# Patient Record
Sex: Female | Born: 1940 | Marital: Married | Smoking: Never smoker
Health system: Southern US, Community
[De-identification: ages and names within clinical notes are randomized; demographics above are authoritative.]

---

## 2013-08-10 ENCOUNTER — Telehealth: Payer: Self-pay | Admitting: *Deleted

## 2013-08-10 NOTE — Telephone Encounter (Signed)
Called pt and confirmed 08/24/13 appt w/ pt.  Mailed before appt letter & packet to pt.  Took paperwork to Med Rec for chart.

## 2013-08-11 ENCOUNTER — Telehealth: Payer: Self-pay | Admitting: Hematology & Oncology

## 2013-08-11 NOTE — Telephone Encounter (Signed)
Received medical records on pt it appears she has been scheduled at [facility] and is aware of appointment

## 2013-08-18 ENCOUNTER — Other Ambulatory Visit: Payer: Self-pay | Admitting: *Deleted

## 2013-08-18 ENCOUNTER — Encounter: Payer: Self-pay | Admitting: *Deleted

## 2013-08-18 DIAGNOSIS — Z853 Personal history of malignant neoplasm of breast: Secondary | ICD-10-CM

## 2013-08-18 NOTE — Progress Notes (Signed)
Completed chart and gave to [facility] to enter labs and review since its an outside pt.

## 2013-08-24 ENCOUNTER — Encounter: Payer: Self-pay | Admitting: Oncology

## 2013-08-24 ENCOUNTER — Ambulatory Visit: Payer: Medicare Other

## 2013-08-24 ENCOUNTER — Ambulatory Visit (HOSPITAL_BASED_OUTPATIENT_CLINIC_OR_DEPARTMENT_OTHER): Payer: Medicare Other | Admitting: Oncology

## 2013-08-24 ENCOUNTER — Encounter (INDEPENDENT_AMBULATORY_CARE_PROVIDER_SITE_OTHER): Payer: Self-pay

## 2013-08-24 ENCOUNTER — Other Ambulatory Visit (HOSPITAL_BASED_OUTPATIENT_CLINIC_OR_DEPARTMENT_OTHER): Payer: Medicare Other | Admitting: Lab

## 2013-08-24 VITALS — BP 160/79 | HR 76 | Temp 97.7°F | Resp 20 | Ht 63.0 in | Wt 203.8 lb

## 2013-08-24 DIAGNOSIS — C50919 Malignant neoplasm of unspecified site of unspecified female breast: Secondary | ICD-10-CM

## 2013-08-24 DIAGNOSIS — Z170 Estrogen receptor positive status [ER+]: Secondary | ICD-10-CM

## 2013-08-24 DIAGNOSIS — C50911 Malignant neoplasm of unspecified site of right female breast: Secondary | ICD-10-CM

## 2013-08-24 DIAGNOSIS — E559 Vitamin D deficiency, unspecified: Secondary | ICD-10-CM

## 2013-08-24 DIAGNOSIS — Z853 Personal history of malignant neoplasm of breast: Secondary | ICD-10-CM

## 2013-08-24 DIAGNOSIS — M810 Age-related osteoporosis without current pathological fracture: Secondary | ICD-10-CM

## 2013-08-24 LAB — COMPREHENSIVE METABOLIC PANEL (CC13)
ALT: 54 U/L (ref 0–55)
AST: 32 U/L (ref 5–34)
Albumin: 3.8 g/dL (ref 3.5–5.0)
Alkaline Phosphatase: 78 U/L (ref 40–150)
BUN: 12.1 mg/dL (ref 7.0–26.0)
Calcium: 10.5 mg/dL — ABNORMAL HIGH (ref 8.4–10.4)
Creatinine: 0.7 mg/dL (ref 0.6–1.1)
Potassium: 3.7 mEq/L (ref 3.5–5.1)
Total Bilirubin: 0.54 mg/dL (ref 0.20–1.20)

## 2013-08-24 LAB — CBC WITH DIFFERENTIAL/PLATELET
Basophils Absolute: 0 10*3/uL (ref 0.0–0.1)
EOS%: 1 % (ref 0.0–7.0)
HCT: 41.2 % (ref 34.8–46.6)
HGB: 13.9 g/dL (ref 11.6–15.9)
MCH: 31.9 pg (ref 25.1–34.0)
MCV: 94.6 fL (ref 79.5–101.0)
MONO#: 0.7 10*3/uL (ref 0.1–0.9)
MONO%: 9.2 % (ref 0.0–14.0)
NEUT#: 3.3 10*3/uL (ref 1.5–6.5)
Platelets: 203 10*3/uL (ref 145–400)
RBC: 4.35 10*6/uL (ref 3.70–5.45)
RDW: 13.2 % (ref 11.2–14.5)

## 2013-08-24 MED ORDER — LETROZOLE 2.5 MG PO TABS: 2.5000 mg | ORAL_TABLET | Freq: Every day | ORAL | Status: AC

## 2013-08-24 NOTE — Patient Instructions (Signed)
We will proceed with getting mammograms in November  You will continue letrozole 2.5 mg daily  We will see you back in April 2015

## 2013-08-24 NOTE — Progress Notes (Signed)
Checked in new pt with no financial concerns. °

## 2013-08-25 ENCOUNTER — Telehealth: Payer: Self-pay | Admitting: Oncology

## 2013-08-25 NOTE — Telephone Encounter (Signed)
lvm for pt regarding to OCT and April 2015 appt...mailed pt avs and letter

## 2013-09-03 NOTE — Progress Notes (Signed)
Gloria Harris [id number] 1941-01-27 72 y.o. 09/03/2013 5:57 PM  CC  No PCP Per Patient [address] [place] [address]  REASON FOR CONSULTATION:  72 year old female with history of locally advanced right breast cancer diagnosed originally in August 2010 stage II.  STAGE:   No matching staging information was found for the patient.  REFERRING PHYSICIAN: [facility] oncology Dr. Leo Rod  HISTORY OF PRESENT ILLNESS:  Gloria Harris is a 72 y.o. female.  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 Patient underwent initially neoadjuvant letrozole starting 07/22/2009  Until she received definitive surgery 02/19/2010. The final pathology revealed a 1.1 cm invasive ductal carcinoma with negative margins node-negative and no vascular invasion. Postoperatively patient continued to receive letrozole after radiation therapy dose completed 05/23/2010. A mammogram performed in November 2013 showed a suspicious lesion that was biopsied and was found to be fat necrosis. Patient is now relocating to [place].   Past Medical History:hypertension osteoporosis hypothyroidism hyperlipidemia  No past medical history on file.  Past Surgical History:lumpectomy of the right breast  No past surgical history on file.  Family History: No family history on file.  Social History History  Substance Use Topics  . Smoking status: Not on file  . Smokeless tobacco: Not on file  . Alcohol Use: Not on file    Allergies: Allergies  Allergen Reactions  . Codeine Palpitations    Current Medications: Current Outpatient Prescriptions  Medication Sig Dispense Refill  . ACTONEL 35 MG tablet       . atorvastatin (LIPITOR) 10 MG  tablet       . bisoprolol (ZEBETA) 5 MG tablet       . letrozole (FEMARA) 2.5 MG tablet Take 1 tablet (2.5 mg total) by mouth daily.  90 tablet  8  . levothyroxine (SYNTHROID, LEVOTHROID) 125 MCG tablet       . valsartan-hydrochlorothiazide (DIOVAN-HCT) 160-12.5 MG per tablet        No current facility-administered medications for this visit.    OB/GYN History:menarche at age 71 menopause at 74 no hormone replacement therapy G2 P2 first live birth at 56  Fertility Discussion: n/a Prior History of Cancer: no  Health Maintenance:  Colonoscopy no Bone Density  Last PAP smear yes  ECOG PERFORMANCE STATUS: 1 - Symptomatic but completely ambulatory  Genetic Counseling/testing: no  REVIEW OF SYSTEMS:  The 14 point review of systems was obtained and it is certainly in the electronic medical record  PHYSICAL EXAMINATION: Blood pressure 160/79, pulse 76, temperature 97.7 F (36.5 C), temperature source Oral, resp. rate 20, height 5\' 3"  (1.6 m), weight 203 lb 12.8 oz (92.443 kg).  XBJ:YNWGN, healthy, no distress, well nourished and well developed SKIN: skin color, texture, turgor are normal HEAD: Normocephalic EYES: PERRLA, EOMI, Conjunctiva are pink and non-injected EARS: External ears normal OROPHARYNX:no exudate, no erythema and lips, buccal mucosa, and tongue normal  NECK: no adenopathy, thyroid normal size, non-tender, without nodularity LYMPH:  no palpable lymphadenopathy, no hepatosplenomegaly BREAST:left breast normal without mass, skin or nipple changes or axillary nodes, surgical scars noted in the right breast no evidence of local recurrenc LUNGS: clear to auscultation  HEART: regular rate & rhythm and no murmurs ABDOMEN:abdomen soft, non-tender, normal bowel sounds and no  masses or organomegaly BACK: Back symmetric, no curvature., No CVA tenderness EXTREMITIES:no edema, no skin discoloration, no clubbing, no cyanosis  NEURO: alert & oriented x 3 with fluent speech, no  focal motor/sensory deficits, gait normal     STUDIES/RESULTS: No results found.   LABS:    Chemistry      Component Value Date/Time   NA 143 08/24/2013 1238   K 3.7 08/24/2013 1238   CO2 26 08/24/2013 1238   BUN 12.1 08/24/2013 1238   CREATININE 0.7 08/24/2013 1238      Component Value Date/Time   CALCIUM 10.5* 08/24/2013 1238   ALKPHOS 78 08/24/2013 1238   AST 32 08/24/2013 1238   ALT 54 08/24/2013 1238   BILITOT 0.54 08/24/2013 1238      Lab Results  Component Value Date   WBC 7.6 08/24/2013   HGB 13.9 08/24/2013   HCT 41.2 08/24/2013   MCV 94.6 08/24/2013   PLT 203 08/24/2013     ASSESSMENT    72 year old female with  #1 history of locally advanced right breast cancer on mammogram measuring 4 cm with a biopsy in August 2000 revealing an invasive ductal and lobular carcinoma that was ER positive PR positive HER-2 with an intermediate proliferation marker Ki-67 of 25%. Patient received neoadjuvant letrozole from September 2000 and 2000 and let. She then went on to have a lumpectomy revealed residual disease a 1.1 cm and was invasive ductal carcinoma margins were negative nodes were negative no vascular invasion noted. She went on to receive radiation therapy completed in July 2011. She has been on letrozole since tolerating it well. Patient and I discussed the side effects risks benefits of letrozole. We discussed length of therapy which will be 5-7 years. Patient however is very concerned about her risk of recurrence. She may be one of those individuals who may want to continue this for at least 10 years. We will discuss this as time goes on. Patient does have history of osteoporosis she is on Actonel. She will need a bone density scan as soon. And she has never had a colonoscopy we discussed this as well at this time she is not willing to go through this.currently is open and willing to think about it.    PLAN:    #1 patient will continue letrozole 2.5 mg  daily.  #2 I will see her back in 6 months time for followup.  #3 she will need to be set up for a bone density scan.  #4 I will set her up for mammograms        Thank you so much for allowing me to participate in the care of [facility]. I will continue to follow up the patient with you and assist in her care.  All questions were answered. The patient knows to call the clinic with any problems, questions or concerns. We can certainly see the patient much sooner if necessary.  I spent 30 minutes counseling the patient face to face. The total time spent in the appointment was 55 minutes.  Drue Second, MD Medical/Oncology [facility] [phone] (beeper) [phone] (Office)

## 2013-09-08 ENCOUNTER — Telehealth: Payer: Self-pay | Admitting: Oncology

## 2013-09-14 ENCOUNTER — Other Ambulatory Visit: Payer: Self-pay

## 2013-09-25 ENCOUNTER — Encounter: Payer: Self-pay | Admitting: *Deleted

## 2013-09-25 ENCOUNTER — Ambulatory Visit
Admission: RE | Admit: 2013-09-25 | Discharge: 2013-09-25 | Disposition: A | Discharge status: Home / Self Care | Payer: Medicare Other | Source: Ambulatory Visit | Attending: Oncology | Admitting: Oncology

## 2013-09-25 DIAGNOSIS — C50911 Malignant neoplasm of unspecified site of right female breast: Secondary | ICD-10-CM

## 2013-09-25 NOTE — Progress Notes (Signed)
Mailed after appt letter to pt. 

## 2013-10-04 NOTE — Telephone Encounter (Signed)
Pt is aware that her appt for 02/28/14 was moved from 12 noon to 2pm...td

## 2013-11-24 ENCOUNTER — Other Ambulatory Visit: Payer: Self-pay | Admitting: Oncology

## 2013-11-24 DIAGNOSIS — N63 Unspecified lump in unspecified breast: Secondary | ICD-10-CM

## 2014-02-27 ENCOUNTER — Telehealth: Payer: Self-pay | Admitting: Oncology

## 2014-02-27 NOTE — Telephone Encounter (Signed)
kk out moved 4/22 appt to 5/29 lb/dr moore. s/w pt and per pt due to being out of town she cannot come 5/29. appts moved from 5/29 to 5/15 lb/dr kamenini. pt aware of new d/t and that she will see dr Andria Frames.

## 2014-02-28 ENCOUNTER — Ambulatory Visit: Payer: Medicare Other | Admitting: Oncology

## 2014-03-23 ENCOUNTER — Telehealth: Payer: Self-pay | Admitting: Hematology and Oncology

## 2014-03-23 ENCOUNTER — Other Ambulatory Visit: Payer: Self-pay | Admitting: Hematology and Oncology

## 2014-03-23 ENCOUNTER — Ambulatory Visit (HOSPITAL_BASED_OUTPATIENT_CLINIC_OR_DEPARTMENT_OTHER): Payer: Medicare Other | Admitting: Hematology and Oncology

## 2014-03-23 VITALS — BP 185/80 | HR 76 | Temp 98.1°F | Resp 20 | Ht 63.0 in | Wt 199.3 lb

## 2014-03-23 DIAGNOSIS — R928 Other abnormal and inconclusive findings on diagnostic imaging of breast: Secondary | ICD-10-CM

## 2014-03-23 DIAGNOSIS — C50919 Malignant neoplasm of unspecified site of unspecified female breast: Secondary | ICD-10-CM | POA: Insufficient documentation

## 2014-03-23 DIAGNOSIS — N63 Unspecified lump in unspecified breast: Secondary | ICD-10-CM

## 2014-03-23 NOTE — Telephone Encounter (Signed)
per pof to sch [place] & mamma-sch appt-pt has copy of sch

## 2014-03-23 NOTE — Progress Notes (Signed)
Gloria Harris [id number] Jan 25, 1941 73 y.o. 03/23/2014 1:17 PM  CC  PCP: Thurston Pounds  Chief complaint :followup visit for breast cancer   REASON FOR CONSULTATION:  73 year old female with history of locally advanced right breast cancer diagnosed originally in August 2010 stage II.  STAGE:   No matching staging information was found for the patient.  REFERRING PHYSICIAN: [facility] medicine oncology Dr. Quin Hoop  HISTORY OF PRESENT ILLNESS: As per previously documented note:  Gloria Harris is a 73 y.o. female.  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 Patient underwent initially neoadjuvant letrozole starting 07/22/2009  Until she received definitive surgery 02/19/2010. The final pathology revealed a 1.1 cm invasive ductal carcinoma with negative margins node-negative and no vascular invasion. Postoperatively patient continued to receive letrozole after radiation therapy dose completed 05/23/2010. A mammogram performed in November 2013 showed a suspicious lesion that was biopsied and was found to be fat necrosis. Patient is now relocating to [place].  Interval history: Gloria Harris is tolerating letrozole quite well with minimal side effects of joint pains. A digital diagnostic mammogram which was performed in November 2014 addendum report revealed right breast nodule and was  recommended to have a spot compression views and possible ultrasound on 10/30/2013. 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 She complains of minimal hot flashes. Denies any shortness of breath, chest pain, palpitations, blood in the stool or blood in the urine. complains of occasional arm fatigue.  She says that because of a dieting and exercise she lost about 10 pounds. Her appetite is good  Past Medical History:hypertension osteoporosis hypothyroidism hyperlipidemia  No past medical history on file.  Past Surgical History:lumpectomy of the right breast  No past surgical history on file.  Family History: No family history on file.  Social History History  Substance Use Topics  . Smoking status: Not on file  . Smokeless tobacco: Not on file  . Alcohol Use: Not on file    Allergies: Allergies  Allergen Reactions  . Codeine Palpitations    Current Medications: Current Outpatient Prescriptions  Medication Sig Dispense Refill  . ACTONEL 35 MG tablet every 7 (seven) days.       Marland Kitchen atorvastatin (LIPITOR) 10 MG tablet Take 10 mg by mouth daily.       . bisoprolol (ZEBETA) 5 MG tablet Take 5 mg by mouth daily.       . Calcium Carbonate (CALTRATE 600 PO) Take 1 tablet by mouth daily.      . cholecalciferol (VITAMIN D) 1000 UNITS tablet Take 1,000 Units by mouth 3 (three) times daily.      Marland Kitchen KLOR-CON M20 20 MEQ tablet Take 20 mEq by mouth once.       Marland Kitchen letrozole (FEMARA) 2.5 MG tablet Take 1 tablet (2.5 mg total) by mouth daily.  90 tablet  8  . levothyroxine (SYNTHROID, LEVOTHROID) 125 MCG tablet Take 125 mcg by mouth daily before breakfast.       . valsartan-hydrochlorothiazide (DIOVAN-HCT) 160-12.5 MG per tablet Take 1 tablet by mouth daily.        No current facility-administered medications for this visit.    OB/GYN History:menarche at age 44 menopause at 63 no  hormone replacement therapy G2 P2 first live birth at 57  Fertility Discussion: n/a Prior History of Cancer: no  Health Maintenance:  Colonoscopy no Bone Density  Last PAP smear yes  ECOG PERFORMANCE STATUS: 1 - Symptomatic but completely ambulatory  Genetic Counseling/testing: no  REVIEW OF SYSTEMS:  The 14 point review of systems was obtained and it is certainly in the electronic medical  record  PHYSICAL EXAMINATION: Blood pressure 185/80, pulse 76, temperature 98.1 F (36.7 C), temperature source Oral, resp. rate 20, height _0  (1.6 m), weight 199 lb 4.8 oz (90.402 kg).  KXF:GHWEX, healthy, no distress, well nourished and well developed SKIN: skin color, texture, turgor are normal HEAD: Normocephalic EYES: PERRLA, EOMI, Conjunctiva are pink and non-injected EARS: External ears normal OROPHARYNX:no exudate, no erythema and lips, buccal mucosa, and tongue normal  NECK: no adenopathy, thyroid normal size, non-tender, without nodularity LYMPH:  no palpable lymphadenopathy, no hepatosplenomegaly BREAST:left breast normal without mass, skin or nipple changes or axillary nodes, surgical scars noted in the right breast with questionable barely palpable lesion was noted in the medial aspect of the right breast  LUNGS: clear to auscultation  HEART: regular rate & rhythm and no murmurs ABDOMEN:abdomen soft, non-tender, normal bowel sounds and no masses or organomegaly BACK: Back symmetric, no curvature., No CVA tenderness EXTREMITIES:no edema, no skin discoloration, no clubbing, no cyanosis  NEURO: alert & oriented x 3 with fluent speech, no focal motor/sensory deficits, gait normal   LABS:    Chemistry      Component Value Date/Time   NA 143 08/24/2013 1238   K 3.7 08/24/2013 1238   CO2 26 08/24/2013 1238   BUN 12.1 08/24/2013 1238   CREATININE 0.7 08/24/2013 1238      Component Value Date/Time   CALCIUM 10.5* 08/24/2013 1238   ALKPHOS 78 08/24/2013 1238   AST 32 08/24/2013 1238   ALT 54 08/24/2013 1238   BILITOT 0.54 08/24/2013 1238      Lab Results  Component Value Date   WBC 7.6 08/24/2013   HGB 13.9 08/24/2013   HCT 41.2 08/24/2013   MCV 94.6 08/24/2013   PLT 203 08/24/2013     ASSESSMENT    73 year old female with  #1  locally advanced right breast cancer on mammogram measuring 4 cm with a biopsy in August 2000 revealing an invasive ductal and  lobular carcinoma that was ER positive PR positive HER-2 with an intermediate proliferation marker Ki-67 of 25%. Patient received neoadjuvant letrozole from September 2000 and 2000 and let. She then went on to have a lumpectomy revealed residual disease a 1.1 cm and was invasive ductal carcinoma margins were negative nodes were negative no vascular invasion noted. She went on to receive radiation therapy completed in July 2011. She has been on letrozole since tolerating it well.   #2. A digital diagnostic mammogram which was performed in November 2014 addendum report revealed right breast nodule and was  recommended to have a spot compression views and possible ultrasound on 10/30/2013. 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 I discussed at length with the patient and her husband on the need of further diagnostic workup.She understood the same and since she is planned to go for cruise, she would like to have the  spot compression mammogram and also ultrasound in June 2 week followed by followup appointment in June 4 week  #3 Continue letrozole 2.5 mg by mouth once daily  to complete  5 years by July 2016. Patient is highly motivated to continue longer  #4 Bone density scan was performed in December 2014 that revealed osteopenia: Continue calcium with vitamin D supplementation and continue Actonel  #5 I have reviewed her CBC and differential and CMP which were done in PCPs office on April 2015 and those are acceptable  #6 I have recommended patient to have a screening colonoscopy  All questions were answered. The patient knows to call the clinic with any problems, questions or concerns. We can certainly see the patient much sooner if necessary.  I spent 20 minutes counseling the patient face to face. The total time spent in the appointment was 22mnutes.  PWilmon Arms MD Medical/Oncology [phone](Office)

## 2014-04-06 ENCOUNTER — Ambulatory Visit: Payer: Medicare Other

## 2014-04-09 ENCOUNTER — Other Ambulatory Visit: Payer: Medicare Other

## 2014-04-12 ENCOUNTER — Ambulatory Visit
Admission: RE | Admit: 2014-04-12 | Discharge: 2014-04-12 | Disposition: A | Discharge status: Home / Self Care | Payer: Medicare Other | Source: Ambulatory Visit | Attending: Hematology and Oncology | Admitting: Hematology and Oncology

## 2014-04-12 ENCOUNTER — Other Ambulatory Visit: Payer: Self-pay | Admitting: Hematology and Oncology

## 2014-04-12 ENCOUNTER — Telehealth: Payer: Self-pay | Admitting: Physician Assistant

## 2014-04-12 DIAGNOSIS — R928 Other abnormal and inconclusive findings on diagnostic imaging of breast: Secondary | ICD-10-CM

## 2014-04-12 DIAGNOSIS — N63 Unspecified lump in unspecified breast: Secondary | ICD-10-CM

## 2014-04-12 NOTE — Telephone Encounter (Signed)
, °

## 2014-04-13 ENCOUNTER — Telehealth: Payer: Self-pay | Admitting: Physician Assistant

## 2014-04-13 NOTE — Telephone Encounter (Signed)
pt cld stating she had cancel her appt and now she can make it-adv pt that time slot happen to be available and I rescappt and gave pt time & date. Pt understood

## 2014-04-25 ENCOUNTER — Other Ambulatory Visit (HOSPITAL_BASED_OUTPATIENT_CLINIC_OR_DEPARTMENT_OTHER): Payer: Medicare Other

## 2014-04-25 ENCOUNTER — Telehealth: Payer: Self-pay | Admitting: Physician Assistant

## 2014-04-25 ENCOUNTER — Ambulatory Visit (HOSPITAL_BASED_OUTPATIENT_CLINIC_OR_DEPARTMENT_OTHER): Payer: Medicare Other | Admitting: Physician Assistant

## 2014-04-25 ENCOUNTER — Ambulatory Visit: Payer: Medicare Other | Admitting: Physician Assistant

## 2014-04-25 ENCOUNTER — Other Ambulatory Visit: Payer: Medicare Other

## 2014-04-25 ENCOUNTER — Encounter: Payer: Self-pay | Admitting: Physician Assistant

## 2014-04-25 VITALS — BP 173/68 | HR 64 | Temp 97.5°F | Resp 20 | Ht 63.0 in | Wt 199.2 lb

## 2014-04-25 DIAGNOSIS — Z170 Estrogen receptor positive status [ER+]: Secondary | ICD-10-CM

## 2014-04-25 DIAGNOSIS — N63 Unspecified lump in unspecified breast: Secondary | ICD-10-CM

## 2014-04-25 DIAGNOSIS — C50919 Malignant neoplasm of unspecified site of unspecified female breast: Secondary | ICD-10-CM

## 2014-04-25 DIAGNOSIS — M899 Disorder of bone, unspecified: Secondary | ICD-10-CM

## 2014-04-25 DIAGNOSIS — M949 Disorder of cartilage, unspecified: Secondary | ICD-10-CM

## 2014-04-25 DIAGNOSIS — M8580 Other specified disorders of bone density and structure, unspecified site: Secondary | ICD-10-CM

## 2014-04-25 DIAGNOSIS — Z853 Personal history of malignant neoplasm of breast: Secondary | ICD-10-CM | POA: Insufficient documentation

## 2014-04-25 LAB — CBC WITH DIFFERENTIAL/PLATELET
BASO%: 0.4 % (ref 0.0–2.0)
Basophils Absolute: 0 10*3/uL (ref 0.0–0.1)
EOS%: 1.8 % (ref 0.0–7.0)
Eosinophils Absolute: 0.1 10*3/uL (ref 0.0–0.5)
HCT: 42.5 % (ref 34.8–46.6)
HGB: 14.3 g/dL (ref 11.6–15.9)
LYMPH%: 40.2 % (ref 14.0–49.7)
MCH: 32.7 pg (ref 25.1–34.0)
MCHC: 33.7 g/dL (ref 31.5–36.0)
MCV: 97 fL (ref 79.5–101.0)
MONO#: 0.7 10*3/uL (ref 0.1–0.9)
MONO%: 9.1 % (ref 0.0–14.0)
NEUT#: 3.8 10*3/uL (ref 1.5–6.5)
NEUT%: 48.5 % (ref 38.4–76.8)
PLATELETS: 243 10*3/uL (ref 145–400)
RBC: 4.38 10*6/uL (ref 3.70–5.45)
RDW: 13.3 % (ref 11.2–14.5)
WBC: 7.8 10*3/uL (ref 3.9–10.3)
lymph#: 3.1 10*3/uL (ref 0.9–3.3)

## 2014-04-25 LAB — COMPREHENSIVE METABOLIC PANEL (CC13)
ALK PHOS: 83 U/L (ref 40–150)
ALT: 48 U/L (ref 0–55)
AST: 29 U/L (ref 5–34)
Albumin: 3.8 g/dL (ref 3.5–5.0)
Anion Gap: 11 mEq/L (ref 3–11)
BILIRUBIN TOTAL: 0.53 mg/dL (ref 0.20–1.20)
BUN: 14.7 mg/dL (ref 7.0–26.0)
CO2: 26 mEq/L (ref 22–29)
CREATININE: 0.7 mg/dL (ref 0.6–1.1)
Calcium: 10.2 mg/dL (ref 8.4–10.4)
Chloride: 106 mEq/L (ref 98–109)
Glucose: 110 mg/dl (ref 70–140)
Potassium: 4 mEq/L (ref 3.5–5.1)
Sodium: 144 mEq/L (ref 136–145)
TOTAL PROTEIN: 7.3 g/dL (ref 6.4–8.3)

## 2014-04-25 NOTE — Telephone Encounter (Signed)
, °

## 2014-04-25 NOTE — Progress Notes (Signed)
Gloria Harris [id number] December 27, 1940 73 y.o. 04/25/2014 10:56 AM  CC  PCP: Thurston Pounds  Chief complaint :followup visit for breast cancer   REASON FOR CONSULTATION:  73 year old female with history of locally advanced right breast cancer diagnosed originally in August 2010 stage II.  STAGE:   No matching staging information was found for the patient.  REFERRING PHYSICIAN: [facility] school of medicine oncology Dr. Quin Hoop  HISTORY OF PRESENT ILLNESS: As per previously documented note:  Gloria Harris is a 73 y.o. female.  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   Patient underwent initially neoadjuvant letrozole starting 07/22/2009  Until she received definitive surgery 02/19/2010. The final pathology revealed a 1.1 cm invasive ductal carcinoma with negative margins node-negative and no vascular invasion. Postoperatively patient continued to receive letrozole after radiation therapy dose completed 05/23/2010.   A mammogram performed in November 2013 showed a suspicious lesion that was biopsied and was found to be fat necrosis.   Subsequent history is as detailed below.  Interval history:  [name] returns today accompanied by her husband for followup of her right breast cancer. She continues on letrozole which she is tolerating very well. She has a history of osteopenia and is taking Actonel with good tolerance.   Interval history is notable for the fact that the patient had a mammogram in December 2014 with a suspicious right breast nodule. For multiple reasons, further evaluation was delayed, but most recently she underwent a right diagnostic mammogram, ultrasound, and biopsy on 04/12/2014. Fortunately pathology was  benign showing fibrosis with slight inflammation. She still has some bruising and tenderness from that procedure, but has had no bleeding or drainage, and no evidence of infection.  Review of Systems: Andreia is feeling well overall. She's had no recent illnesses and denies any fevers, chills, or night sweats. She has no significant hot flashes. She denies any vaginal dryness. She denies any skin changes or rashes and has had no abnormal bruising or bleeding. Her energy level is fairly good. Her appetite is good. She has been trying to lose weight, and is trying to eat a healthy diet and exercise more. She's had no nausea or reflux and denies any change in either bowel or bladder habits. She's had no cough, phlegm production, pleurisy, increased shortness of breath, chest pain, or palpitations. She has only minimal swelling in the lower extremities at the end of the day, and this improves with elevation. She denies any abnormal headaches or dizziness and has had no change in vision. Currently, she has some tenderness in the right breast status post biopsy. Otherwise, she has some arthritic pain which is stable, and feels a little stiff when she first stands up. She denies any new myalgias, arthralgias, or bony pain.  A detailed review of systems is otherwise stable and noncontributory.    Past Medical History:hypertension osteoporosis hypothyroidism hyperlipidemia  History reviewed. No pertinent past medical history.  Past Surgical History:lumpectomy of the right breast  History reviewed. No pertinent past surgical history.  Family History: History reviewed. No pertinent family history.  Social History History  Substance Use Topics  . Smoking status: Never Smoker   . Smokeless tobacco: Never Used  . Alcohol Use: No    Allergies: Allergies  Allergen Reactions  .  Codeine Palpitations    Current Medications: Current Outpatient Prescriptions  Medication Sig Dispense Refill  . ACTONEL 35  MG tablet every 7 (seven) days.       Marland Kitchen atorvastatin (LIPITOR) 10 MG tablet Take 10 mg by mouth daily.       . bisoprolol (ZEBETA) 5 MG tablet Take 5 mg by mouth daily.       . Calcium Carbonate (CALTRATE 600 PO) Take 1 tablet by mouth daily.      . cholecalciferol (VITAMIN D) 1000 UNITS tablet Take 1,000 Units by mouth 3 (three) times daily.      Marland Kitchen KLOR-CON M20 20 MEQ tablet Take 20 mEq by mouth once.       Marland Kitchen letrozole (FEMARA) 2.5 MG tablet Take 1 tablet (2.5 mg total) by mouth daily.  90 tablet  8  . levothyroxine (SYNTHROID, LEVOTHROID) 125 MCG tablet Take 125 mcg by mouth daily before breakfast.       . valsartan-hydrochlorothiazide (DIOVAN-HCT) 160-12.5 MG per tablet Take 1 tablet by mouth daily.        No current facility-administered medications for this visit.    OB/GYN History:menarche at age 39 menopause at 35 no hormone replacement therapy G2 P2 first live birth at 60  Fertility Discussion: n/a Prior History of Cancer: no  Health Maintenance:  Colonoscopy no Bone Density:  Being scheduled for December 2015 Last PAP:  Not on file    PHYSICAL EXAMINATION: Middle-aged female who appears comfortable and is in no acute distress. Filed Vitals:   04/25/14 1011  BP: 173/68  Pulse: 64  Temp: 97.5 F (36.4 C)  Resp: 20  Body mass index is 35.3 kg/(m^2).  ECOG: 0 Filed Weights   04/25/14 1011  Weight: 199 lb 3.2 oz (90.357 kg)   Physical Exam: HEENT:  Sclerae anicteric.  Oropharynx clear. Buccal mucosa is pink and moist. Neck supple, trachea midline. NODES:  No cervical or supraclavicular lymphadenopathy palpated.  BREAST EXAM:  Right breast is status post lumpectomy. Also status post recent biopsy with residual ecchymoses diffusely across the surface of the breast. There is no erythema or drainage, and no evidence of infection. No additional skin changes. Left breast is unremarkable. Axillae are benign bilaterally, with no palpable lymphadenopathy. LUNGS:  Clear to  auscultation bilaterally.  No wheezes or rhonchi HEART:  Regular rate and rhythm. No murmur appreciated. ABDOMEN:  Soft, nontender. No organomegaly or masses palpated.  Positive bowel sounds.  MSK:  No focal spinal tenderness to palpation. Good range of motion bilaterally in the upper tremor these. EXTREMITIES:  No peripheral edema.  No lymphedema in the right upper extremity. SKIN:  No visible rashes. No excessive ecchymoses or petechiae. No pallor. Good skin turgor. NEURO:  Nonfocal. Well oriented.  Appropriate affect.    LABS:    Chemistry      Component Value Date/Time   NA 144 04/25/2014 0926   K 4.0 04/25/2014 0926   CO2 26 04/25/2014 0926   BUN 14.7 04/25/2014 0926   CREATININE 0.7 04/25/2014 0926      Component Value Date/Time   CALCIUM 10.2 04/25/2014 0926   ALKPHOS 83 04/25/2014 0926   AST 29 04/25/2014 0926   ALT 48 04/25/2014 0926   BILITOT 0.53 04/25/2014 0926      Lab Results  Component Value Date   WBC 7.8 04/25/2014   HGB 14.3 04/25/2014   HCT 42.5 04/25/2014   MCV 97.0 04/25/2014   PLT 243 04/25/2014   Studies: Mm Digital  Diagnostic Unilat R 04/12/2014   CLINICAL DATA:  Malignant right breast lumpectomy in 2011 with adjuvant radiation therapy in the upper-outer quadrant. Patient had an ultrasound-guided core needle biopsy of a mass in the inner right breast at the [facility] in November, 2014, pathology revealing fat necrosis. She presents today for followup of the right breast after the biopsy.  EXAM: DIGITAL DIAGNOSTIC RIGHT MAMMOGRAM  ULTRASOUND RIGHT BREAST  COMPARISON:  Mammography 09/25/2013, 09/28/2012, dating back to 08/25/2011. Right breast ultrasound 09/14/2012. The imaging in 2013 and was performed at [facility] in [place].  ACR Breast Density Category b: There are scattered areas of fibroglandular density.  FINDINGS: There is an irregular mass with associated microcalcification in the inner right breast at the approximate 3 o'clock position,  approximately 2 cm posterior and superior to the clip placed at the time of the prior biopsy. This mass has increased in size since the prior mammogram in 2013, but is relatively stable since the it, 2014 examination. There is no associated architectural distortion.  Scarring and dystrophic calcifications from the prior lumpectomy in the upper outer right breast are again noted.  On physical exam, there is a firm fixed approximate 1-2 cm mass in the inner left breast. Scarring is present in the upper and outer right breast from the prior lumpectomy. Scarring is present in the right axilla from prior node dissection.  Ultrasound is performed, showing an irregular hypoechoic shadowing mass at the 3 o'clock position of the right breast approximately 6 cm from the nipple measuring approximately 1.7 x 0.8 x 1.4 cm. There is likely involvement of the skin. There is internal color Doppler flow.  Sonographic evaluation of the right axilla demonstrated no pathologic lymphadenopathy.  IMPRESSION: 1. Highly suspicious 1.7 cm mass at the 3 o'clock position of the right breast approximately 7 cm from the nipple, likely with skin involvement. 2. No pathologic right axillary lymphadenopathy.  RECOMMENDATION: Ultrasound-guided core needle biopsy of the highly suspicious mass in the inner right breast.  The ultrasound core needle biopsy procedure was discussed in detail with the patient and all of her questions were answered. She agreed to proceed and this was performed subsequently today and is reported separately.  I have discussed the findings and recommendations with the patient. Results were also provided in writing at the conclusion of the visit.  BI-RADS CATEGORY  5: Highly suggestive of malignancy.   Electronically Signed   By: Evangeline Dakin M.D.   On: 04/12/2014 10:24    [place] Rt Breast Bx W Loc Dev 1st Lesion Img Bx Spec US Guide 04/16/2014   ADDENDUM REPORT: 04/16/2014 07:21  ADDENDUM: Pathology revealed fibrosis with  slight inflammation in the right breast. This was found to be concordant by Dr. Conchita Paris. Pathology was relayed to the patient by telephone. The patient reported doing well after the biopsy. Post biopsy instructions were reviewed and her questions were answered. She was encouraged to call [facility] for any additional concerns. She was asked to return in 6 months for right diagnostic mammography and possible ultrasound.  Pathology results reported by Susa Raring RN, BSN on April 16, 2014.   Electronically Signed   By: Evangeline Dakin M.D.   On: 04/16/2014 07:21   04/16/2014   CLINICAL DATA:  Highly suspicious approximate 1.7 cm mass in the inner right breast on diagnostic workup earlier today. Prior history of malignant lumpectomy of the upper-outer right breast in 2011 with adjuvant radiation therapy.  EXAM: ULTRASOUND GUIDED RIGHT BREAST CORE NEEDLE BIOPSY  COMPARISON:  Previous exams.  PROCEDURE: I met with the patient and we discussed the procedure of ultrasound-guided biopsy, including benefits and alternatives. We discussed the high likelihood of a successful procedure. We discussed the risks of the procedure including infection, bleeding, tissue injury, clip migration, and inadequate sampling. Informed written consent was given. The usual time-out protocol was performed immediately prior to the procedure.  Using sterile technique and 2% Lidocaine as local anesthetic, under direct ultrasound visualization, a 12 gauge vacuum-assisted device was used to perform biopsy of the suspicious 1.7 cm mass 3 o'clock position of the right breast approximately 6 cm from the nipple using an inferior approach. A tissue marker clip was not placed at the request of the patient.  IMPRESSION: Ultrasound-guided biopsy of a suspicious 1.7 cm mass in the inner right breast. No apparent complications.  Please note that the patient requested that a tissue marker clip not be placed. If pathology  returns as a breast malignancy and the patient decides on a breast sparing procedure, localization can be performed with ultrasound prior to surgery.  Electronically Signed: By: Evangeline Dakin M.D. On: 04/12/2014 10:57    ASSESSMENT    73 year old female with  #1  locally advanced right breast cancer on mammogram measuring 4 cm with a biopsy in August 2000 revealing an invasive ductal and lobular carcinoma that was ER positive PR positive HER-2 with an intermediate proliferation marker Ki-67 of 25%.   #2  Patient received neoadjuvant letrozole from September 2000 and 2011. She then went on to have a lumpectomy revealed residual disease a 1.1 cm and was invasive ductal carcinoma margins were negative nodes were negative no vascular invasion noted.   #3  She went on to receive radiation therapy completed in July 2011.   #4  She has been on letrozole since July 2011, tolerating it well, the plan being to continue for total of 5 years (until July 2016) Patient is highly motivated to continue the antiestrogen medication longer.  #5  A digital diagnostic mammogram which was performed in November 2014 addendum report revealed right breast nodule and was  recommended to have a spot compression views and possible ultrasound on 10/30/2013. 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 Subsequently, a right diagnostic mammogram, right ultrasound, and a right breast biopsy were obtained in June 2015, pathology revealing fibrosis with slight inflammation, but no evidence of malignancy.  #6 Bone density scan was performed in December 2014 that revealed osteopenia: Continue calcium with vitamin D supplementation and continue Actonel (prescribed by PCP)   Plan: Erma appears to be doing well with regards to her breast cancer, and will continue on the letrozole as before. It has been recommended that she be followed with a short-term followup in 6 months, and this  will put [place] back in December of this year at which time she is actually due for her bilateral mammogram. Accordingly, I am ordering a bilateral mammogram and right ultrasound for December. She will not be due for repeat bone density until December 2016. She'll continue on the Actonel as before.  Shanyia will return for followup in approximately 6 months, and we will try to schedule this after she has had her next mammogram and ultrasound. She will be due for both labs and physical exam at that time. All of the above was reviewed in detail with the patient and her  husband today. Both of them voiced their understanding and agreement with our plan, and they know to call with any changes or problems.   Micah Flesher, PA-C Medical/Oncology [facility] [phone] (Office)

## 2014-09-10 ENCOUNTER — Telehealth: Payer: Self-pay | Admitting: Hematology and Oncology

## 2014-09-10 NOTE — Telephone Encounter (Signed)
, °

## 2014-09-27 ENCOUNTER — Other Ambulatory Visit: Payer: Medicare Other

## 2014-11-14 ENCOUNTER — Other Ambulatory Visit: Payer: Medicare Other

## 2014-11-23 ENCOUNTER — Ambulatory Visit: Payer: Medicare Other | Admitting: Hematology and Oncology

## 2014-11-29 IMAGING — MG MM DIGITAL DIAGNOSTIC UNILAT*R*
3 series · 3 of 3 positions shown · non-contrast
Comparison: Mammography 09/25/2013, 09/28/2012, dating back to
08/25/2011.

CLINICAL DATA: Malignant right breast lumpectomy in 9633 with
adjuvant radiation therapy in the upper-outer quadrant. Patient had
an ultrasound-guided core needle biopsy of a mass in the inner right
breast at [REDACTED] [REDACTED] in September 2013, pathology
revealing fat necrosis. She presents today for followup of the right
breast after the biopsy.

EXAM:
DIGITAL DIAGNOSTIC RIGHT MAMMOGRAM
ULTRASOUND RIGHT BREAST

[R CC]
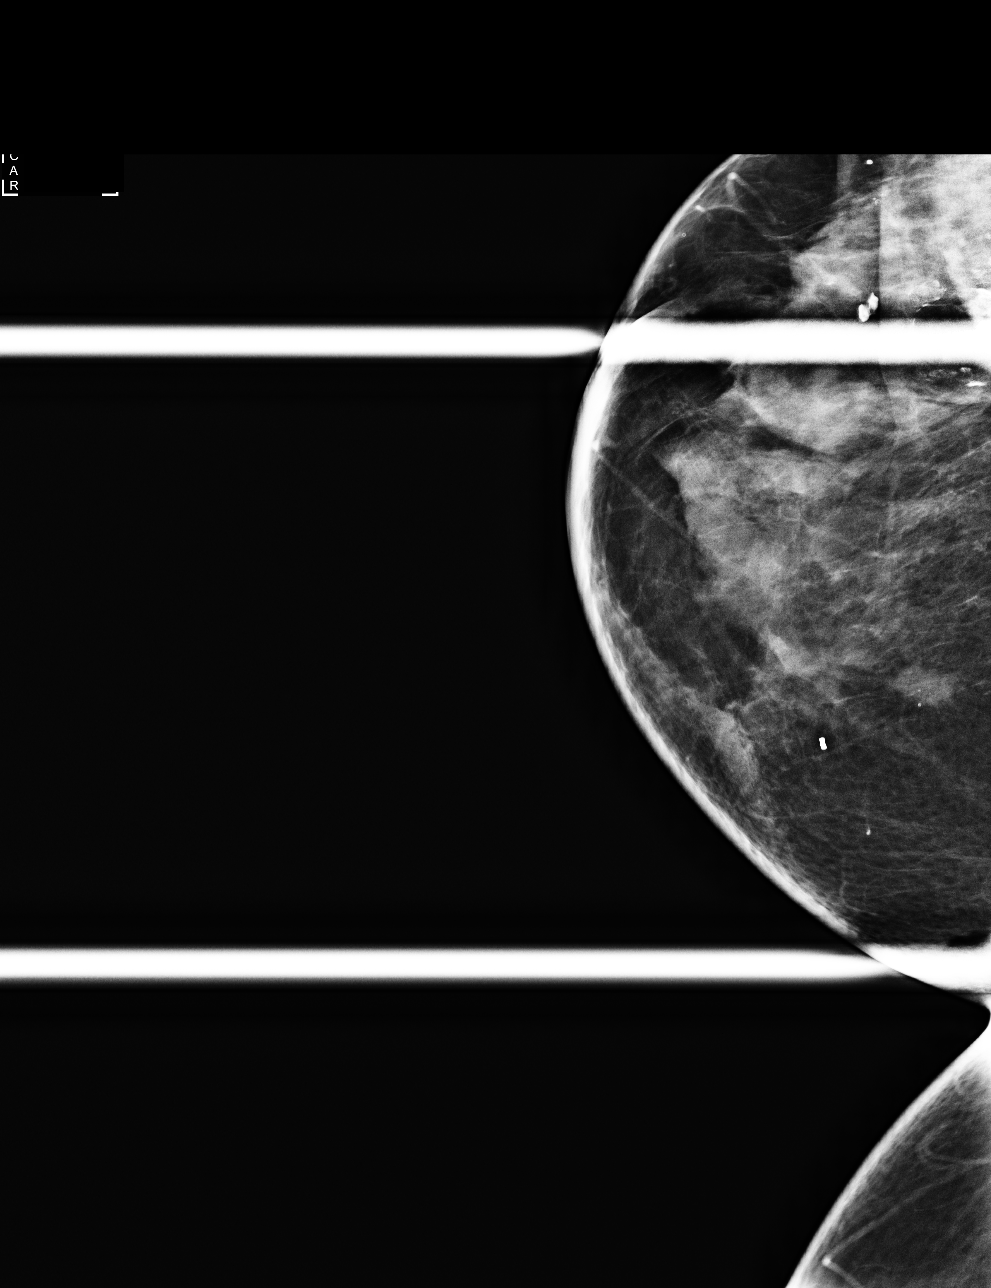

[R MLO (1 of 2)]
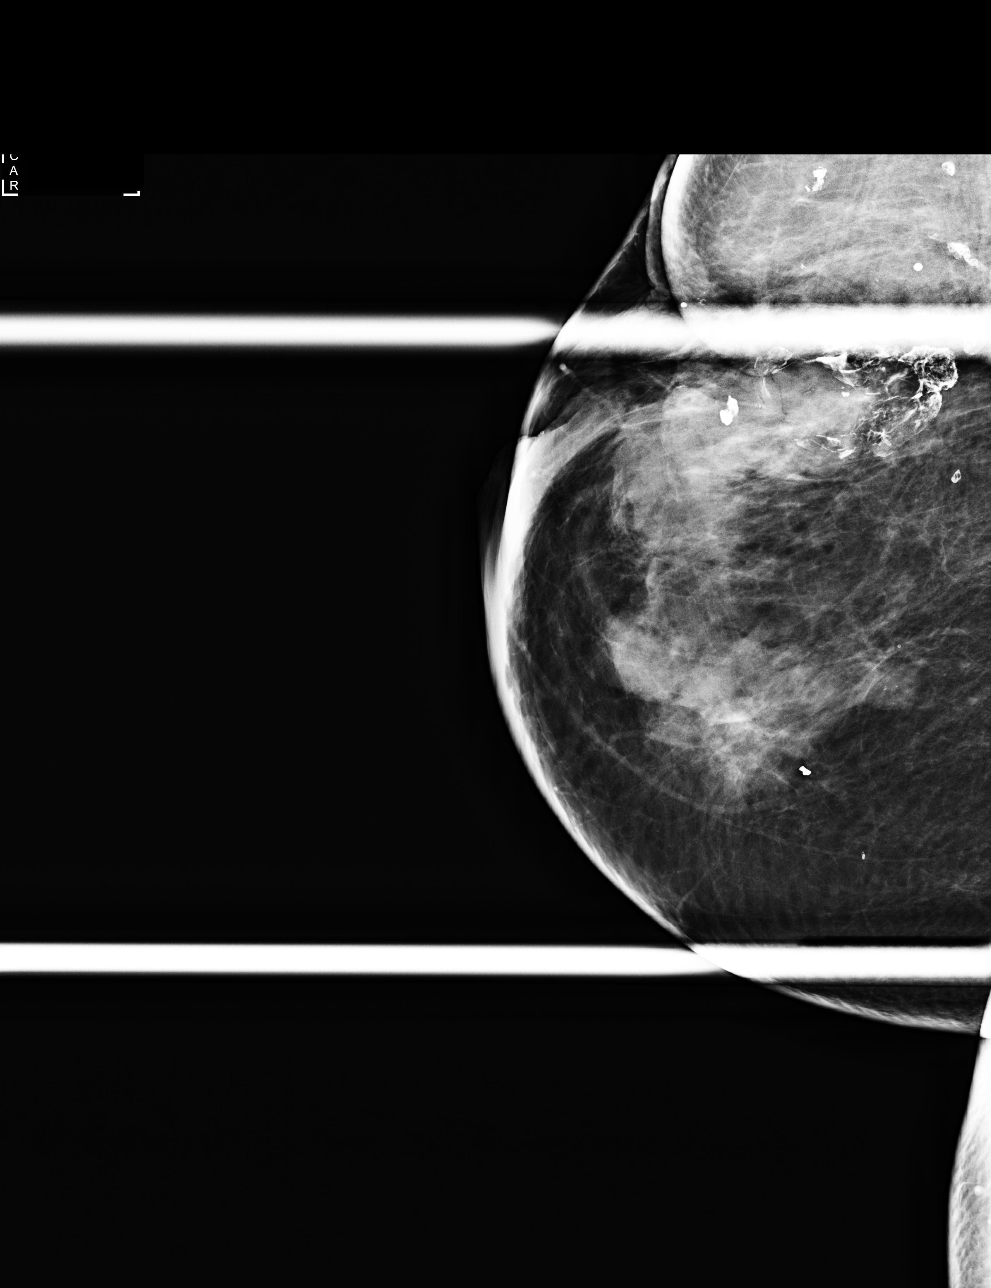

[R MLO (2 of 2)]
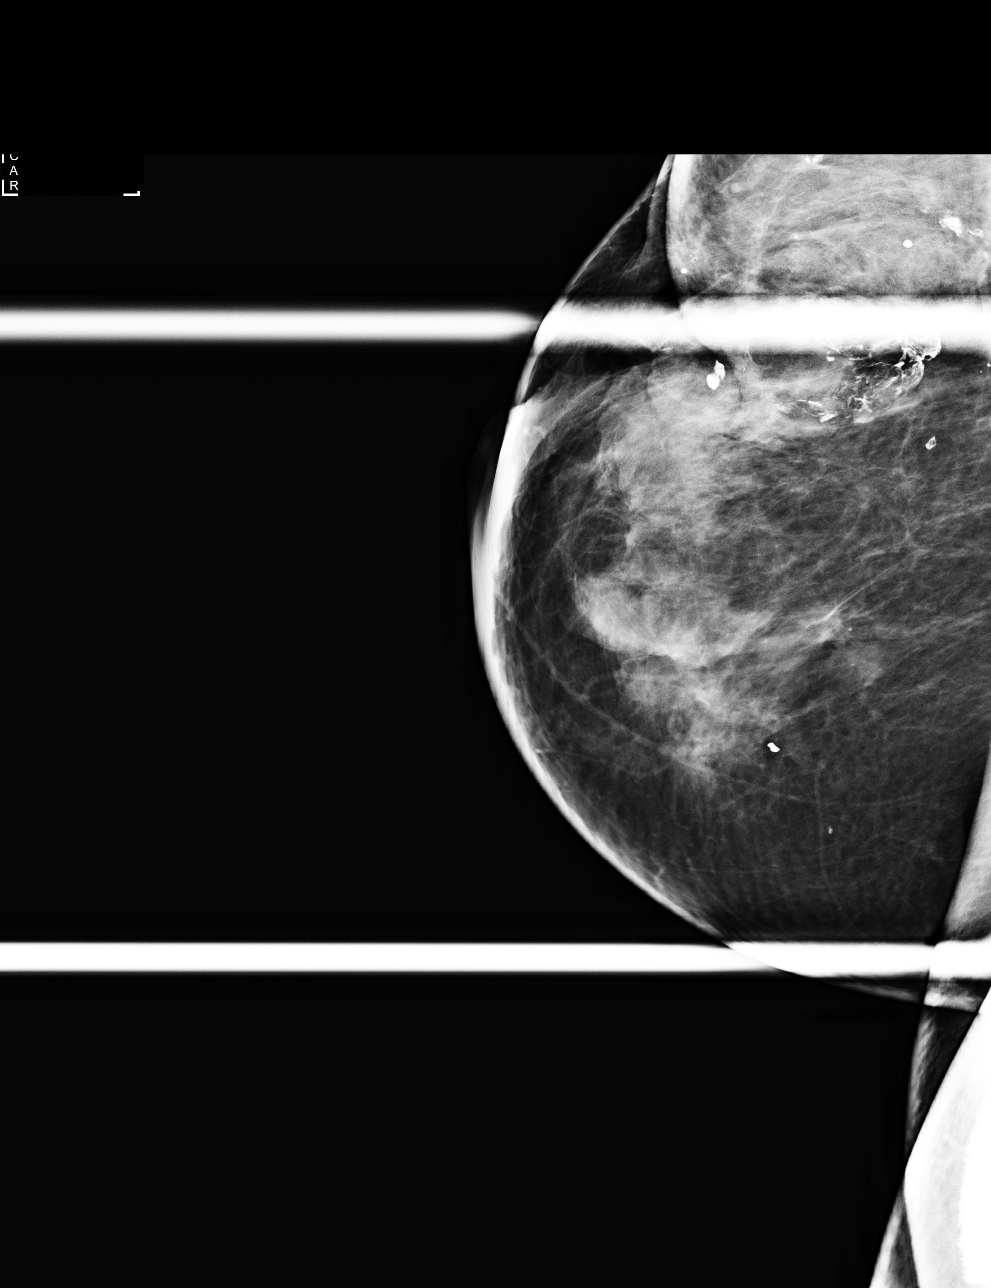

[3 of 3 positions shown; findings below may reference images not displayed]

Right breast ultrasound 09/14/2012. The imaging in 3169
and was performed at [REDACTED] in [place].

ACR Breast Density Category b: There are scattered areas of
fibroglandular density.
FINDINGS: There is an irregular mass with associated microcalcification in the
inner right breast at the approximate 3 o'clock position,
approximately 2 cm posterior and superior to the clip placed at the
time of the prior biopsy. This mass has increased in size since the
examination. There is no associated architectural distortion.

Scarring and dystrophic calcifications from the prior lumpectomy in
the upper outer right breast are again noted.

On physical exam, there is a firm fixed approximate 1-2 cm mass in
the inner left breast. Scarring is present in the upper and outer
right breast from the prior lumpectomy. Scarring is present in the
right axilla from prior node dissection.

Ultrasound is performed, showing an irregular hypoechoic shadowing
mass at the 3 o'clock position of the right breast approximately 6
cm from the nipple measuring approximately 1.7 x 0.8 x 1.4 cm. There
is likely involvement of the skin. There is internal color Doppler
flow.

Sonographic evaluation of the right axilla demonstrated no
pathologic lymphadenopathy.
IMPRESSION: 1. Highly suspicious 1.7 cm mass at the 3 o'clock position of the
right breast approximately 7 cm from the nipple, likely with skin
involvement.
2. No pathologic right axillary lymphadenopathy.

RECOMMENDATION:
Ultrasound-guided core needle biopsy of the highly suspicious mass
in the inner right breast.

The ultrasound core needle biopsy procedure was discussed in detail
with the patient and all of her questions were answered. She agreed
to proceed and this was performed subsequently today and is reported
separately.

I have discussed the findings and recommendations with the patient.
Results were also provided in writing at the conclusion of the
visit.

BI-RADS CATEGORY  5: Highly suggestive of malignancy.

## 2014-11-29 IMAGING — US US BREAST LTD UNI RIGHT INC AXILLA
1 series · 12 of 12 positions shown · non-contrast
Comparison: Mammography 09/25/2013, 09/28/2012, dating back to
08/25/2011.

CLINICAL DATA: Malignant right breast lumpectomy in 9633 with
adjuvant radiation therapy in the upper-outer quadrant. Patient had
an ultrasound-guided core needle biopsy of a mass in the inner right
breast at [REDACTED] [REDACTED] in September 2013, pathology
revealing fat necrosis. She presents today for followup of the right
breast after the biopsy.

EXAM:
DIGITAL DIAGNOSTIC RIGHT MAMMOGRAM
ULTRASOUND RIGHT BREAST

[Series 1: superficial breast · 12 of 12 slices shown]
[im 1/12]
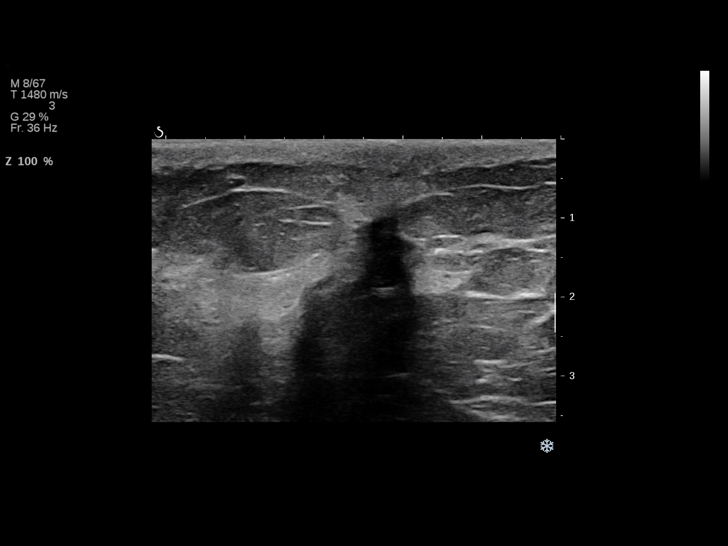
[im 2/12]
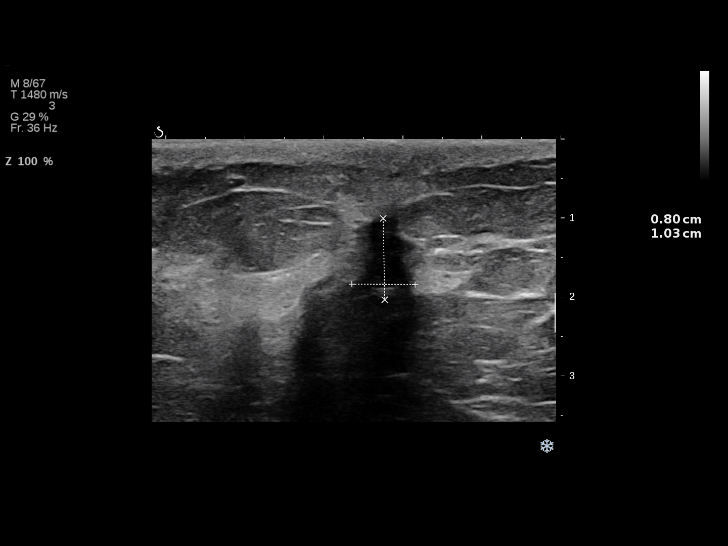
[im 3/12]
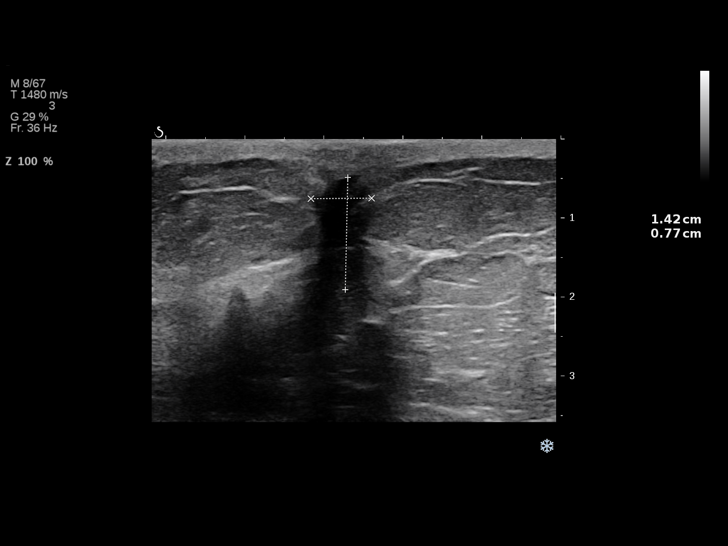
[im 4/12]
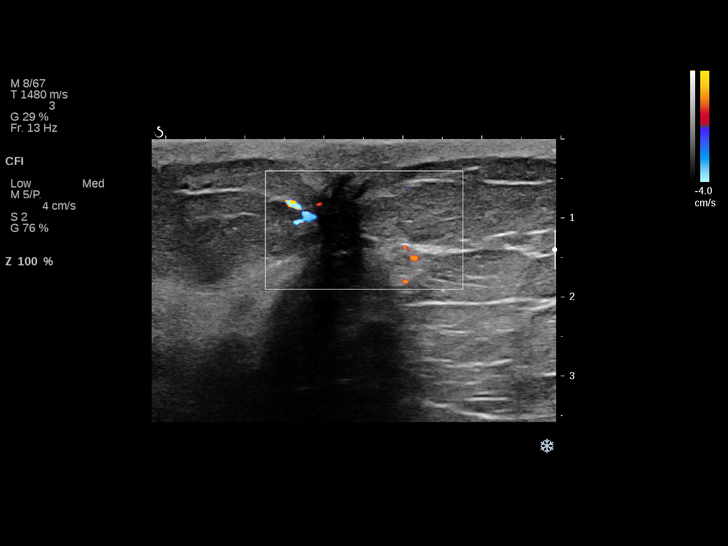
[im 5/12]
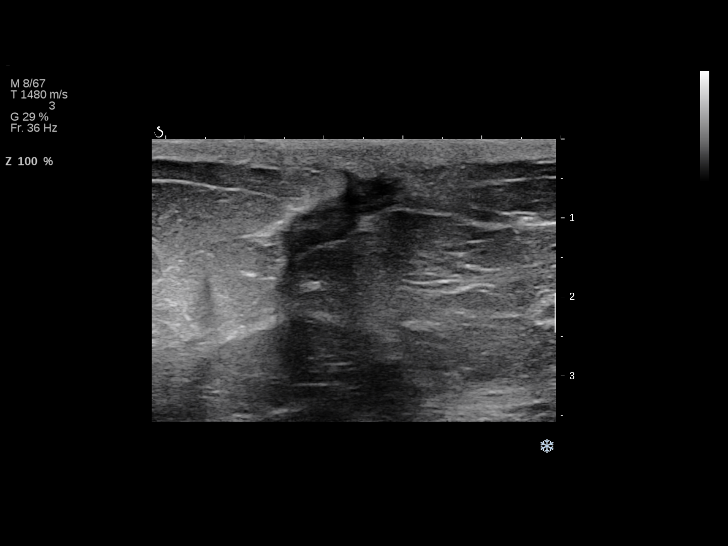
[im 6/12]
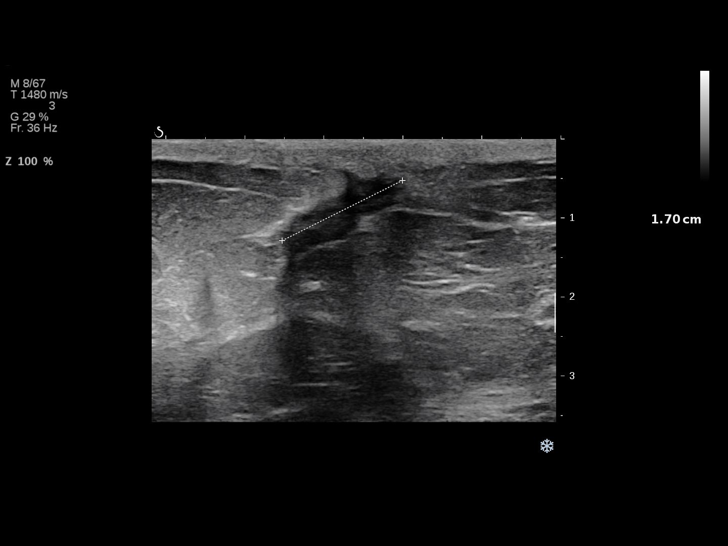
[im 7/12]
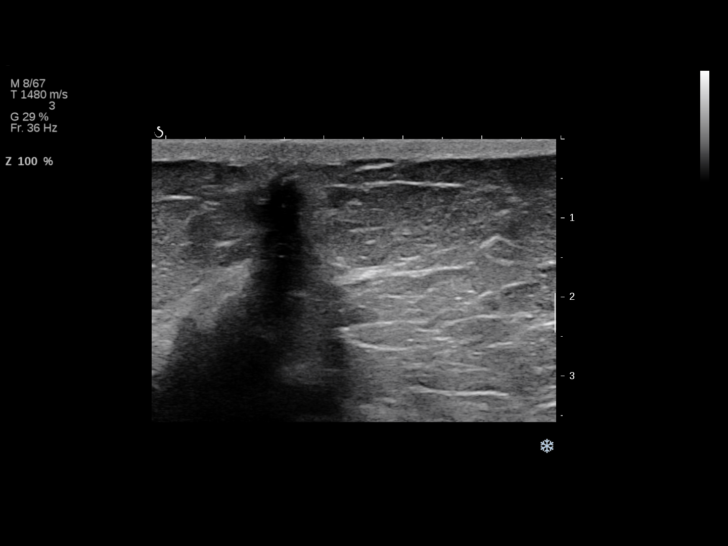
[im 8/12]
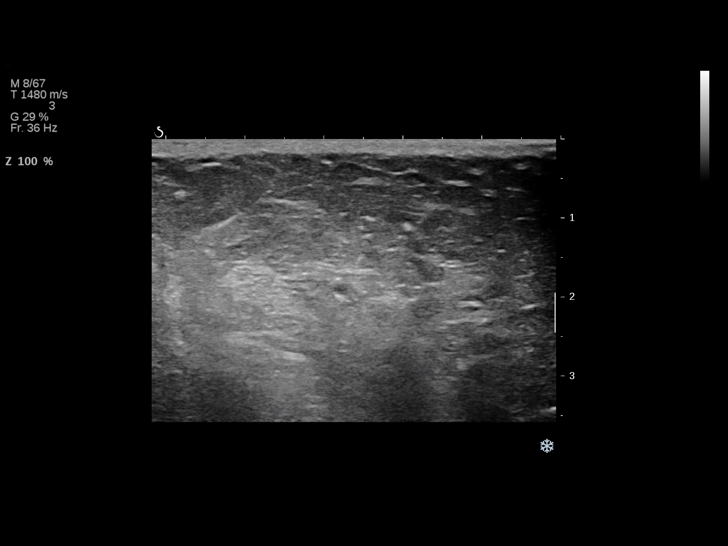
[im 9/12]
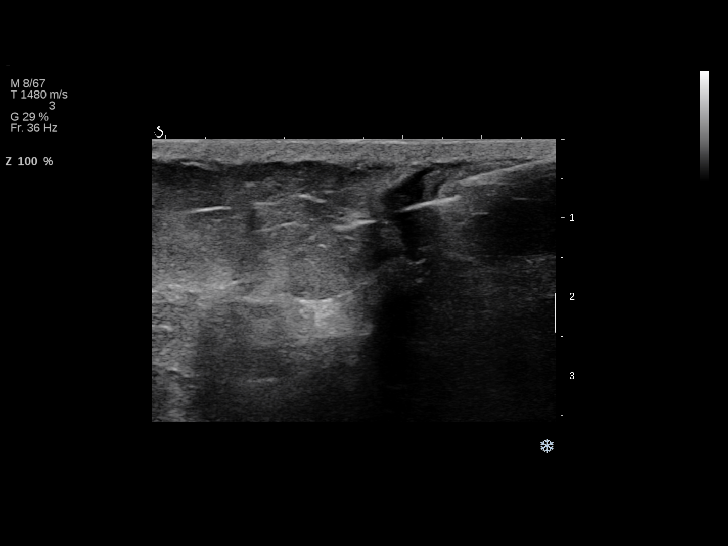
[im 10/12]
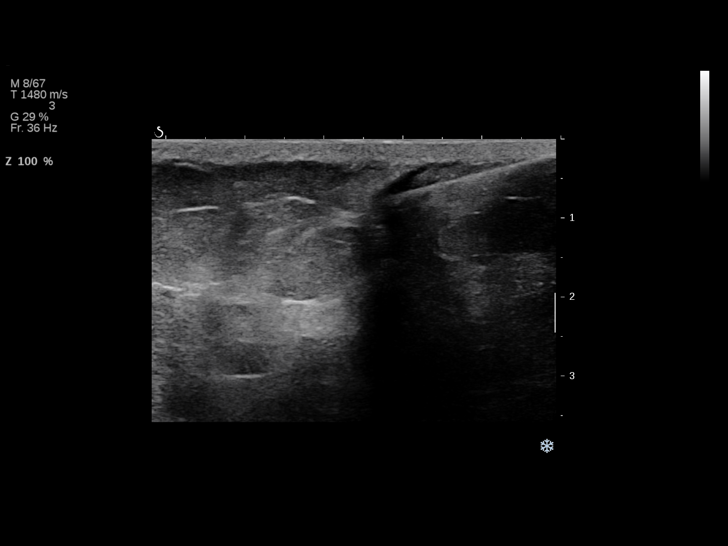
[im 11/12]
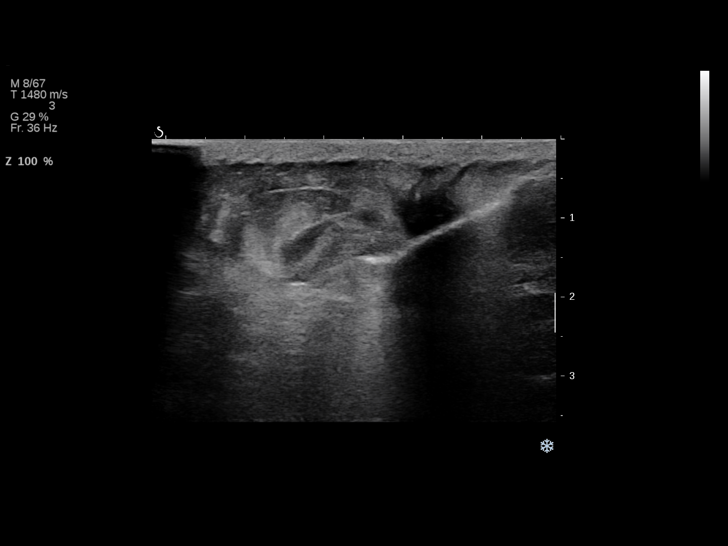
[im 12/12]
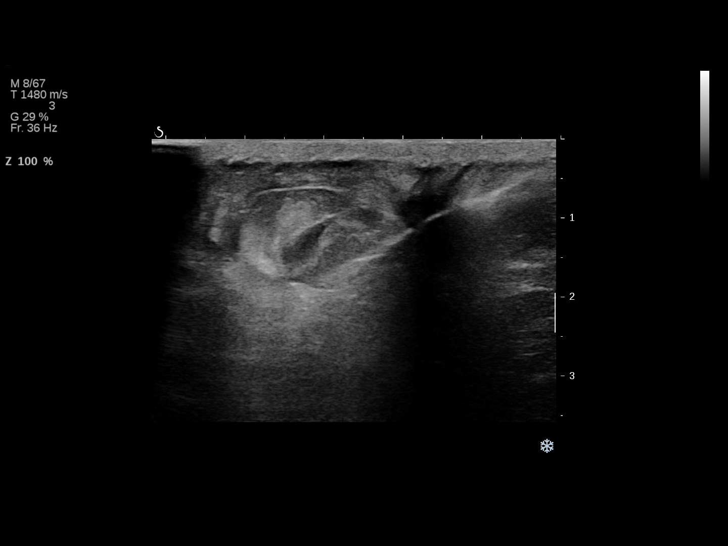

[12 of 12 positions shown; findings below may reference images not displayed]

Right breast ultrasound 09/14/2012. The imaging in 3169
and was performed at [REDACTED] in [place].

ACR Breast Density Category b: There are scattered areas of
fibroglandular density.
FINDINGS: There is an irregular mass with associated microcalcification in the
inner right breast at the approximate 3 o'clock position,
approximately 2 cm posterior and superior to the clip placed at the
time of the prior biopsy. This mass has increased in size since the
examination. There is no associated architectural distortion.

Scarring and dystrophic calcifications from the prior lumpectomy in
the upper outer right breast are again noted.

On physical exam, there is a firm fixed approximate 1-2 cm mass in
the inner left breast. Scarring is present in the upper and outer
right breast from the prior lumpectomy. Scarring is present in the
right axilla from prior node dissection.

Ultrasound is performed, showing an irregular hypoechoic shadowing
mass at the 3 o'clock position of the right breast approximately 6
cm from the nipple measuring approximately 1.7 x 0.8 x 1.4 cm. There
is likely involvement of the skin. There is internal color Doppler
flow.

Sonographic evaluation of the right axilla demonstrated no
pathologic lymphadenopathy.
IMPRESSION: 1. Highly suspicious 1.7 cm mass at the 3 o'clock position of the
right breast approximately 7 cm from the nipple, likely with skin
involvement.
2. No pathologic right axillary lymphadenopathy.

RECOMMENDATION:
Ultrasound-guided core needle biopsy of the highly suspicious mass
in the inner right breast.

The ultrasound core needle biopsy procedure was discussed in detail
with the patient and all of her questions were answered. She agreed
to proceed and this was performed subsequently today and is reported
separately.

I have discussed the findings and recommendations with the patient.
Results were also provided in writing at the conclusion of the
visit.

BI-RADS CATEGORY  5: Highly suggestive of malignancy.

## 2016-10-09 DEATH — deceased
